# Patient Record
Sex: Female | Born: 1980 | Race: White | Hispanic: No | Marital: Married | Smoking: Never smoker
Health system: Southern US, Community
[De-identification: ages and names within clinical notes are randomized; demographics above are authoritative.]

## PROBLEM LIST (undated history)

## (undated) DIAGNOSIS — D219 Benign neoplasm of connective and other soft tissue, unspecified: Secondary | ICD-10-CM

---

## 2005-03-09 ENCOUNTER — Other Ambulatory Visit: Admission: RE | Admit: 2005-03-09 | Discharge: 2005-03-09 | Payer: Self-pay | Admitting: Family Medicine

## 2006-03-21 ENCOUNTER — Other Ambulatory Visit: Admission: RE | Admit: 2006-03-21 | Discharge: 2006-03-21 | Payer: Self-pay | Admitting: Family Medicine

## 2006-04-26 ENCOUNTER — Encounter: Admission: RE | Admit: 2006-04-26 | Discharge: 2006-04-26 | Payer: Self-pay | Admitting: Family Medicine

## 2007-04-25 ENCOUNTER — Other Ambulatory Visit: Admission: RE | Admit: 2007-04-25 | Discharge: 2007-04-25 | Payer: Self-pay | Admitting: Family Medicine

## 2008-05-09 ENCOUNTER — Other Ambulatory Visit: Admission: RE | Admit: 2008-05-09 | Discharge: 2008-05-09 | Payer: Self-pay | Admitting: Family Medicine

## 2008-09-26 ENCOUNTER — Encounter: Admission: RE | Admit: 2008-09-26 | Discharge: 2008-09-26 | Payer: Self-pay | Admitting: Family Medicine

## 2009-05-09 ENCOUNTER — Other Ambulatory Visit: Admission: RE | Admit: 2009-05-09 | Discharge: 2009-05-09 | Payer: Self-pay | Admitting: Family Medicine

## 2010-06-03 ENCOUNTER — Other Ambulatory Visit (HOSPITAL_COMMUNITY)
Admission: RE | Admit: 2010-06-03 | Discharge: 2010-06-03 | Disposition: A | Discharge status: Home / Self Care | Payer: BC Managed Care – PPO | Source: Ambulatory Visit | Attending: Family Medicine | Admitting: Family Medicine

## 2010-06-03 ENCOUNTER — Other Ambulatory Visit: Payer: Self-pay | Admitting: Family Medicine

## 2010-06-03 ENCOUNTER — Other Ambulatory Visit: Payer: Self-pay | Admitting: Physician Assistant

## 2010-06-03 DIAGNOSIS — Z124 Encounter for screening for malignant neoplasm of cervix: Secondary | ICD-10-CM | POA: Insufficient documentation

## 2010-06-03 DIAGNOSIS — Z09 Encounter for follow-up examination after completed treatment for conditions other than malignant neoplasm: Secondary | ICD-10-CM

## 2010-06-09 ENCOUNTER — Ambulatory Visit
Admission: RE | Admit: 2010-06-09 | Discharge: 2010-06-09 | Disposition: A | Discharge status: Home / Self Care | Payer: BC Managed Care – PPO | Source: Ambulatory Visit | Attending: Family Medicine | Admitting: Family Medicine

## 2010-06-09 DIAGNOSIS — Z09 Encounter for follow-up examination after completed treatment for conditions other than malignant neoplasm: Secondary | ICD-10-CM

## 2011-06-04 ENCOUNTER — Other Ambulatory Visit: Payer: Self-pay | Admitting: Family Medicine

## 2011-06-04 ENCOUNTER — Other Ambulatory Visit (HOSPITAL_COMMUNITY)
Admission: RE | Admit: 2011-06-04 | Discharge: 2011-06-04 | Disposition: A | Discharge status: Home / Self Care | Payer: BC Managed Care – PPO | Source: Ambulatory Visit | Attending: Family Medicine | Admitting: Family Medicine

## 2011-06-04 DIAGNOSIS — D249 Benign neoplasm of unspecified breast: Secondary | ICD-10-CM

## 2011-06-04 DIAGNOSIS — Z124 Encounter for screening for malignant neoplasm of cervix: Secondary | ICD-10-CM | POA: Insufficient documentation

## 2011-06-15 ENCOUNTER — Ambulatory Visit
Admission: RE | Admit: 2011-06-15 | Discharge: 2011-06-15 | Disposition: A | Discharge status: Home / Self Care | Payer: BC Managed Care – PPO | Source: Ambulatory Visit | Attending: Family Medicine | Admitting: Family Medicine

## 2011-06-15 DIAGNOSIS — D249 Benign neoplasm of unspecified breast: Secondary | ICD-10-CM

## 2011-12-04 ENCOUNTER — Encounter (HOSPITAL_COMMUNITY): Payer: Self-pay | Admitting: Physical Medicine and Rehabilitation

## 2011-12-04 ENCOUNTER — Emergency Department (HOSPITAL_COMMUNITY): Payer: BC Managed Care – PPO

## 2011-12-04 ENCOUNTER — Emergency Department (HOSPITAL_COMMUNITY)
Admission: EM | Admit: 2011-12-04 | Discharge: 2011-12-04 | Disposition: A | Discharge status: Home / Self Care | Payer: BC Managed Care – PPO | Attending: Emergency Medicine | Admitting: Emergency Medicine

## 2011-12-04 DIAGNOSIS — M79609 Pain in unspecified limb: Secondary | ICD-10-CM | POA: Insufficient documentation

## 2011-12-04 DIAGNOSIS — W1809XA Striking against other object with subsequent fall, initial encounter: Secondary | ICD-10-CM | POA: Insufficient documentation

## 2011-12-04 DIAGNOSIS — Y9364 Activity, baseball: Secondary | ICD-10-CM | POA: Insufficient documentation

## 2011-12-04 DIAGNOSIS — S53106A Unspecified dislocation of unspecified ulnohumeral joint, initial encounter: Secondary | ICD-10-CM | POA: Insufficient documentation

## 2011-12-04 MED ORDER — HYDROMORPHONE HCL PF 1 MG/ML IJ SOLN
0.5000 mg | Freq: Once | INTRAMUSCULAR | Status: DC
  Filled 2011-12-04: qty 1

## 2011-12-04 MED ORDER — HYDROMORPHONE HCL PF 1 MG/ML IJ SOLN: 1.0000 mg | Freq: Once | INTRAMUSCULAR | Status: DC

## 2011-12-04 MED ORDER — ONDANSETRON HCL 4 MG/2ML IJ SOLN
4.0000 mg | Freq: Once | INTRAMUSCULAR | Status: AC
  Administered 2011-12-04: 4 mg via INTRAVENOUS
  Filled 2011-12-04: qty 2

## 2011-12-04 MED ORDER — HYDROMORPHONE HCL PF 1 MG/ML IJ SOLN
INTRAMUSCULAR | Status: AC
  Filled 2011-12-04: qty 1

## 2011-12-04 MED ORDER — HYDROMORPHONE HCL PF 1 MG/ML IJ SOLN
0.5000 mg | Freq: Once | INTRAMUSCULAR | Status: AC
  Administered 2011-12-04: 0.5 mg via INTRAVENOUS

## 2011-12-04 MED ORDER — METOCLOPRAMIDE HCL 5 MG/ML IJ SOLN
5.0000 mg | Freq: Once | INTRAMUSCULAR | Status: AC
  Administered 2011-12-04: 5 mg via INTRAVENOUS
  Filled 2011-12-04: qty 2

## 2011-12-04 MED ORDER — PROPOFOL 10 MG/ML IV BOLUS
INTRAVENOUS | Status: AC
  Filled 2011-12-04: qty 40

## 2011-12-04 MED ORDER — ONDANSETRON HCL 4 MG/2ML IJ SOLN
INTRAMUSCULAR | Status: AC
  Administered 2011-12-04: 4 mg via INTRAVENOUS
  Filled 2011-12-04: qty 2

## 2011-12-04 MED ORDER — HYDROCODONE-ACETAMINOPHEN 5-500 MG PO TABS: 1.0000 | ORAL_TABLET | Freq: Four times a day (QID) | ORAL | Status: AC | PRN

## 2011-12-04 MED ORDER — IBUPROFEN 600 MG PO TABS: 600.0000 mg | ORAL_TABLET | Freq: Four times a day (QID) | ORAL | Status: AC | PRN

## 2011-12-04 MED ORDER — PROPOFOL 10 MG/ML IV BOLUS
INTRAVENOUS | Status: AC | PRN
  Administered 2011-12-04: 80 mg via INTRAVENOUS

## 2011-12-04 MED ORDER — PROPOFOL 10 MG/ML IV BOLUS: 0.5000 mg/kg | Freq: Once | INTRAVENOUS | Status: DC

## 2011-12-04 MED ORDER — HYDROMORPHONE HCL PF 1 MG/ML IJ SOLN
1.0000 mg | Freq: Once | INTRAMUSCULAR | Status: AC
  Administered 2011-12-04: 1 mg via INTRAVENOUS

## 2011-12-04 MED ORDER — ONDANSETRON HCL 4 MG/2ML IJ SOLN
4.0000 mg | Freq: Once | INTRAMUSCULAR | Status: AC
  Administered 2011-12-04: 4 mg via INTRAVENOUS

## 2011-12-04 NOTE — Progress Notes (Signed)
Orthopedic Tech Progress Note Patient Details:  Sherry Herman Mar 28, 1981 [id number]  Ortho Devices Type of Ortho Device: Arm foam sling;Long arm splint Ortho Device/Splint Location: (L) UE Ortho Device/Splint Interventions: Application   Jennye Moccasin 12/04/2011, 9:41 PM

## 2011-12-04 NOTE — ED Provider Notes (Signed)
History     CSN: [id number]  Arrival date & time 12/04/11  [name]   First MD Initiated Contact with Patient 12/04/11 2105      Chief Complaint  Patient presents with  . Arm Pain    (Consider location/radiation/quality/duration/timing/severity/associated sxs/prior treatment) HPI Comments: Sherry Herman states she was playing softball when she dove for a ball landing with her outstretched left hand feeling a pop in her elbow.  She has an obvious deformity to the elbow.  Full range of motion of the wrist.  Positive distal pulses and, color  Patient is a 31 y.o. female presenting with arm pain. The history is provided by the patient.  Arm Pain This is a new problem. The current episode started today. The problem occurs constantly. The problem has been unchanged. Associated symptoms include joint swelling. Pertinent negatives include no fever, nausea, numbness or weakness.    No past medical history on file.  No past surgical history on file.  No family history on file.  History  Substance Use Topics  . Smoking status: Never Smoker   . Smokeless tobacco: Not on file  . Alcohol Use: No    OB History    Grav Para Term Preterm Abortions TAB SAB Ect Mult Living                  Review of Systems  Constitutional: Negative for fever.  HENT: Negative for ear pain.   Gastrointestinal: Negative for nausea.  Musculoskeletal: Positive for joint swelling.  Skin: Negative for wound.  Neurological: Negative for dizziness, weakness and numbness.    Allergies  Review of patient's allergies indicates no known allergies.  Home Medications  No current outpatient prescriptions on file.  BP 118/79  Pulse 73  Temp 98.7 F (37.1 C) (Oral)  Resp 22  SpO2 98%  LMP 11/27/2011  Physical Exam  Constitutional: She appears well-developed and well-nourished.  HENT:  Head: Normocephalic.  Eyes: Pupils are equal, round, and reactive to light.  Neck: Normal range of motion.  Cardiovascular:  Normal rate.   Musculoskeletal: She exhibits tenderness.       Arms: Neurological: She is alert.  Skin: Skin is warm.    ED Course  ORTHOPEDIC INJURY TREATMENT Date/Time: 12/04/2011 10:14 PM Performed by: Arman Filter Authorized by: Arman Filter Consent: Written consent obtained. Consent given by: patient Patient understanding: patient states understanding of the procedure being performed Patient consent: the patient's understanding of the procedure matches consent given Procedure consent: procedure consent matches procedure scheduled Relevant documents: relevant documents present and verified Imaging studies: imaging studies available Patient identity confirmed: verbally with patient Time out: Immediately prior to procedure a "time out" was called to verify the correct patient, procedure, equipment, support staff and site/side marked as required. Injury location: elbow Location details: left elbow Injury type: dislocation Dislocation type: posterior Pre-procedure neurovascular assessment: neurovascularly intact Pre-procedure distal perfusion: normal Pre-procedure neurological function: normal Pre-procedure range of motion: reduced Local anesthesia used: no Patient sedated: yes Sedatives: propofol Analgesia: hydromorphone Manipulation performed: yes Reduction method: supination and flexion Reduction successful: yes X-ray confirmed reduction: yes Immobilization: splint Post-procedure distal perfusion: normal Post-procedure neurological function: normal Post-procedure range of motion: normal Patient tolerance: Patient tolerated the procedure well with no immediate complications.   (including critical care time)  Labs Reviewed - No data to display Dg Elbow 2 Views Left  12/04/2011  *RADIOLOGY REPORT*  Clinical Data: 31 year old female status post blunt trauma with pain.  LEFT ELBOW - 2 VIEW  Comparison: None.  Findings: Posterior dislocation of the ulna and radial head  from the distal left humerus.  Overriding of fragments by a 25 mm. Lateral dislocation as well, up to one half shaft width.  No definite fracture fragments are identified.  IMPRESSION: Posterior and lateral dislocation of both the proximal ulna and radial head at the elbow.  Posterior overriding of fragments by 26 mm.   Original Report Authenticated By: Harley Hallmark, M.D.    Dg Humerus Left  12/04/2011  *RADIOLOGY REPORT*  Clinical Data: 31 year old female with pain after blunt trauma.  LEFT HUMERUS - 2+ VIEW  Comparison: Left elbow series from the same day reported separately.  Findings: Fracture dislocation of the left elbow.  More proximal left humerus appears intact.  Grossly normal alignment about the left shoulder.  Negative visualized ribs.  IMPRESSION:  1.  Left elbow fracture dislocation, the left elbow series reported separately. 2.  More proximal left humerus and osseous structures about the left shoulder appear intact.   Original Report Authenticated By: Harley Hallmark, M.D.      No diagnosis found.  Procedural sedation conducted by Dr. Donette Larry,  manipulation and relocation was performed by myself  MDM   Will start IV pain control, conscience sedation to relocate  Successful relocation of the posterior dislocation of the ulna and radial head.  Patient was placed in a long-arm posterior splint.  Will followup with orthopedics.  Next week        Arman Filter, NP 12/04/11 2219  Arman Filter, NP 12/04/11 2221

## 2011-12-04 NOTE — ED Notes (Signed)
Pt presents to department for evaluation of L arm pain. States she was playing softball when she dove for ball on ground and landed on L elbow/arm. Obvious deformity noted upon arrival. Also states numbness/tingling to fingers. Able to wiggle digits. Pulses intact. Also states nausea. She is alert and oriented x4.

## 2011-12-04 NOTE — ED Notes (Signed)
Pt awake at this time, able to state name, location, and president.

## 2011-12-04 NOTE — ED Notes (Signed)
Pt given saltine crackers and coca cola.

## 2011-12-04 NOTE — ED Provider Notes (Signed)
Procedural sedation Performed by: Glynn Octave Consent: Verbal consent obtained. Risks and benefits: risks, benefits and alternatives were discussed Required items: required blood products, implants, devices, and special equipment available Patient identity confirmed: arm band and provided demographic data Time out: Immediately prior to procedure a "time out" was called to verify the correct patient, procedure, equipment, support staff and site/side marked as required.  Sedation type: moderate (conscious) sedation NPO time confirmed and considedered  Sedatives: PROPOFOL  Physician Time at Bedside: 15 minutes  Vitals: Vital signs were monitored during sedation. Cardiac Monitor, pulse oximeter Patient tolerance: Patient tolerated the procedure well with no immediate complications. Comments: Pt with uneventful recovered. Returned to pre-procedural sedation baseline  Medical screening examination/treatment/procedure(s) were conducted as a shared visit with non-physician practitioner(s) and myself.  I personally evaluated the patient during the encounter      Glynn Octave, MD 12/04/11 2149

## 2011-12-04 NOTE — ED Notes (Signed)
Patient states her L arm feels much better at this time.

## 2011-12-05 NOTE — ED Provider Notes (Signed)
Medical screening examination/treatment/procedure(s) were conducted as a shared visit with non-physician practitioner(s) and myself.  I personally evaluated the patient during the encounter   Glynn Octave, MD 12/05/11 [phone]

## 2012-07-27 ENCOUNTER — Other Ambulatory Visit (HOSPITAL_COMMUNITY)
Admission: RE | Admit: 2012-07-27 | Discharge: 2012-07-27 | Disposition: A | Discharge status: Home / Self Care | Payer: BC Managed Care – PPO | Source: Ambulatory Visit | Attending: Family Medicine | Admitting: Family Medicine

## 2012-07-27 ENCOUNTER — Other Ambulatory Visit: Payer: Self-pay | Admitting: Physician Assistant

## 2012-07-27 DIAGNOSIS — Z124 Encounter for screening for malignant neoplasm of cervix: Secondary | ICD-10-CM | POA: Insufficient documentation

## 2013-07-30 ENCOUNTER — Other Ambulatory Visit (HOSPITAL_COMMUNITY)
Admission: RE | Admit: 2013-07-30 | Discharge: 2013-07-30 | Disposition: A | Discharge status: Home / Self Care | Payer: BC Managed Care – PPO | Source: Ambulatory Visit | Attending: Family Medicine | Admitting: Family Medicine

## 2013-07-30 ENCOUNTER — Other Ambulatory Visit: Payer: Self-pay | Admitting: Physician Assistant

## 2013-07-30 DIAGNOSIS — Z124 Encounter for screening for malignant neoplasm of cervix: Secondary | ICD-10-CM | POA: Insufficient documentation

## 2014-08-01 ENCOUNTER — Other Ambulatory Visit (HOSPITAL_COMMUNITY): Payer: Self-pay | Admitting: Physician Assistant

## 2014-08-01 ENCOUNTER — Other Ambulatory Visit (HOSPITAL_COMMUNITY)
Admission: RE | Admit: 2014-08-01 | Discharge: 2014-08-01 | Disposition: A | Discharge status: Home / Self Care | Payer: BC Managed Care – PPO | Source: Ambulatory Visit | Attending: Family Medicine | Admitting: Family Medicine

## 2014-08-01 DIAGNOSIS — Z124 Encounter for screening for malignant neoplasm of cervix: Secondary | ICD-10-CM | POA: Diagnosis present

## 2014-08-05 LAB — CYTOLOGY - PAP

## 2015-08-05 ENCOUNTER — Other Ambulatory Visit: Payer: Self-pay | Admitting: Physician Assistant

## 2015-08-21 LAB — CYTOLOGY - PAP

## 2016-08-05 ENCOUNTER — Other Ambulatory Visit: Payer: Self-pay | Admitting: Physician Assistant

## 2016-08-05 ENCOUNTER — Other Ambulatory Visit (HOSPITAL_COMMUNITY)
Admission: RE | Admit: 2016-08-05 | Discharge: 2016-08-05 | Disposition: A | Discharge status: Home / Self Care | Payer: BC Managed Care – PPO | Source: Ambulatory Visit | Attending: Family Medicine | Admitting: Family Medicine

## 2016-08-05 DIAGNOSIS — Z124 Encounter for screening for malignant neoplasm of cervix: Secondary | ICD-10-CM | POA: Insufficient documentation

## 2016-08-10 LAB — CYTOLOGY - PAP: DIAGNOSIS: NEGATIVE

## 2018-12-14 ENCOUNTER — Other Ambulatory Visit: Payer: Self-pay | Admitting: Physician Assistant

## 2018-12-14 DIAGNOSIS — N852 Hypertrophy of uterus: Secondary | ICD-10-CM

## 2018-12-20 ENCOUNTER — Ambulatory Visit
Admission: RE | Admit: 2018-12-20 | Discharge: 2018-12-20 | Disposition: A | Discharge status: Home / Self Care | Payer: BC Managed Care – PPO | Source: Ambulatory Visit | Attending: Physician Assistant | Admitting: Physician Assistant

## 2018-12-20 ENCOUNTER — Other Ambulatory Visit: Payer: Self-pay

## 2018-12-20 DIAGNOSIS — N852 Hypertrophy of uterus: Secondary | ICD-10-CM

## 2019-04-25 ENCOUNTER — Inpatient Hospital Stay: Admit: 2019-04-25 | Payer: BC Managed Care – PPO | Admitting: Obstetrics and Gynecology

## 2019-04-25 SURGERY — MYOMECTOMY, ABDOMINAL APPROACH
Anesthesia: Choice

## 2019-07-03 ENCOUNTER — Other Ambulatory Visit: Payer: Self-pay | Admitting: Obstetrics and Gynecology

## 2019-08-07 NOTE — Progress Notes (Signed)
[id number] - [place], [place] - [address] [place] [address] Phone: [phone] Fax: [phone]    Your procedure is scheduled on Wednesday, May 19th.  Report to [facility] Main Entrance "A" at 6:30 A.M., and check in at the Admitting office.  Call this number if you have problems the morning of surgery:  [phone]  Call [phone] if you have any questions prior to your surgery date Monday-Friday 8am-4pm   Remember:  Do not eat after midnight the night before your surgery  You may drink clear liquids until 5:30 A.M. the morning of your surgery.   Clear liquids allowed are: Water, Non-Citrus Juices (without pulp), Carbonated Beverages, Clear Tea, Black Coffee Only, and Gatorade    Take these medicines the morning of surgery with A SIP OF WATER  TRI-LO-MARZIA  As of today, STOP taking any Aspirin (unless otherwise instructed by your surgeon) and Aspirin containing products, Aleve, Naproxen, Ibuprofen, Motrin, Advil, Goody's, BC's, all herbal medications, fish oil, and all vitamins.             Do not wear jewelry, make up, or nail polish            Do not wear lotions, powders, perfumes, or deodorant.            Do not shave 48 hours prior to surgery.              Do not bring valuables to the hospital.            [facility] is not responsible for any belongings or valuables.  Do NOT Smoke (Tobacco/Vapping) or drink Alcohol 24 hours prior to your procedure If you use a CPAP at night, you may bring all equipment for your overnight stay.   Contacts, glasses, dentures or bridgework may not be worn into surgery.      For patients admitted to the hospital, discharge time will be determined by your treatment team.   Patients discharged the day of surgery will not be allowed to drive home, and someone needs to stay with them for 24 hours.  Special instructions:   [place]- Preparing For Surgery  Before surgery, you can play an important  role. Because skin is not sterile, your skin needs to be as free of germs as possible. You can reduce the number of germs on your skin by washing with CHG (chlorahexidine gluconate) Soap before surgery.  CHG is an antiseptic cleaner which kills germs and bonds with the skin to continue killing germs even after washing.    Oral Hygiene is also important to reduce your risk of infection.  Remember - BRUSH YOUR TEETH THE MORNING OF SURGERY WITH YOUR REGULAR TOOTHPASTE  Please do not use if you have an allergy to CHG or antibacterial soaps. If your skin becomes reddened/irritated stop using the CHG.  Do not shave (including legs and underarms) for at least 48 hours prior to first CHG shower. It is OK to shave your face.  Please follow these instructions carefully.   1. Shower the NIGHT BEFORE SURGERY and the MORNING OF SURGERY with CHG Soap.   2. If you chose to wash your hair, wash your hair first as usual with your normal shampoo.  3. After you shampoo, rinse your hair and body thoroughly to remove the shampoo.  4. Use CHG as you would any other liquid soap. You can apply CHG directly to the skin and wash gently with a scrungie  or a clean washcloth.   5. Apply the CHG Soap to your body ONLY FROM THE NECK DOWN.  Do not use on open wounds or open sores. Avoid contact with your eyes, ears, mouth and genitals (private parts). Wash Face and genitals (private parts)  with your normal soap.   6. Wash thoroughly, paying special attention to the area where your surgery will be performed.  7. Thoroughly rinse your body with warm water from the neck down.  8. DO NOT shower/wash with your normal soap after using and rinsing off the CHG Soap.  9. Pat yourself dry with a CLEAN TOWEL.  10. Wear CLEAN PAJAMAS to bed the night before surgery, wear comfortable clothes the morning of surgery  11. Place CLEAN SHEETS on your bed the night of your first shower and DO NOT SLEEP WITH PETS.  Day of  Surgery: Shower with CHG as instructed above.  Do not apply any deodorants/lotions.  Please wear clean clothes to the hospital/surgery center.   Remember to brush your teeth WITH YOUR REGULAR TOOTHPASTE.   Please read over the following fact sheets that you were given.

## 2019-08-08 ENCOUNTER — Encounter (HOSPITAL_COMMUNITY): Payer: Self-pay

## 2019-08-08 ENCOUNTER — Other Ambulatory Visit: Payer: Self-pay

## 2019-08-08 ENCOUNTER — Encounter (HOSPITAL_COMMUNITY)
Admission: RE | Admit: 2019-08-08 | Discharge: 2019-08-08 | Disposition: A | Discharge status: Home / Self Care | Payer: BC Managed Care – PPO | Source: Ambulatory Visit | Attending: Obstetrics and Gynecology | Admitting: Obstetrics and Gynecology

## 2019-08-08 DIAGNOSIS — Z01812 Encounter for preprocedural laboratory examination: Secondary | ICD-10-CM | POA: Diagnosis not present

## 2019-08-08 LAB — CBC
HCT: 34.1 % — ABNORMAL LOW (ref 36.0–46.0)
Hemoglobin: 9.9 g/dL — ABNORMAL LOW (ref 12.0–15.0)
MCH: 19.8 pg — ABNORMAL LOW (ref 26.0–34.0)
MCHC: 29 g/dL — ABNORMAL LOW (ref 30.0–36.0)
MCV: 68.3 fL — ABNORMAL LOW (ref 80.0–100.0)
Platelets: 332 10*3/uL (ref 150–400)
RBC: 4.99 MIL/uL (ref 3.87–5.11)
RDW: 17.2 % — ABNORMAL HIGH (ref 11.5–15.5)
WBC: 8.5 10*3/uL (ref 4.0–10.5)
nRBC: 0 % (ref 0.0–0.2)

## 2019-08-08 LAB — TYPE AND SCREEN
ABO/RH(D): A POS
Antibody Screen: NEGATIVE

## 2019-08-08 LAB — ABO/RH: ABO/RH(D): A POS

## 2019-08-08 NOTE — Progress Notes (Signed)
PCP - [place], PA  Cardiologist - pt denies  Chest x-ray - n/a EKG - n/a Stress Test - pt denies ECHO - pt denies Cardiac Cath - pt denies  Blood Thinner Instructions: n/a Aspirin Instructions:n/a  ERAS Protcol - yes PRE-SURGERY Ensure or G2- not ordered  COVID TEST- 08/13/19  Coronavirus Screening  Have you experienced the following symptoms:  Cough yes/no: No Fever (>100.68F)  yes/no: No Runny nose yes/no: No Sore throat yes/no: No Difficulty breathing/shortness of breath  yes/no: No  Have you or a family member traveled in the last 14 days and where? yes/no: No   If the patient indicates "YES" to the above questions, their PAT will be rescheduled to limit the exposure to others and, the surgeon will be notified. THE PATIENT WILL NEED TO BE ASYMPTOMATIC FOR 14 DAYS.   If the patient is not experiencing any of these symptoms, the PAT nurse will instruct them to NOT bring anyone with them to their appointment since they may have these symptoms or traveled as well.   Please remind your patients and families that hospital visitation restrictions are in effect and the importance of the restrictions.     Anesthesia review: n/a  Patient denies shortness of breath, fever, cough and chest pain at PAT appointment   All instructions explained to the patient, with a verbal understanding of the material. Patient agrees to go over the instructions while at home for a better understanding. Patient also instructed to self quarantine after being tested for COVID-19. The opportunity to ask questions was provided.

## 2019-08-13 ENCOUNTER — Other Ambulatory Visit (HOSPITAL_COMMUNITY)
Admission: RE | Admit: 2019-08-13 | Discharge: 2019-08-13 | Disposition: A | Discharge status: Home / Self Care | Payer: BC Managed Care – PPO | Source: Ambulatory Visit | Attending: Obstetrics and Gynecology | Admitting: Obstetrics and Gynecology

## 2019-08-13 ENCOUNTER — Other Ambulatory Visit: Payer: Self-pay | Admitting: Physician Assistant

## 2019-08-13 ENCOUNTER — Other Ambulatory Visit (HOSPITAL_COMMUNITY)
Admission: RE | Admit: 2019-08-13 | Discharge: 2019-08-13 | Disposition: A | Discharge status: Home / Self Care | Payer: BC Managed Care – PPO | Source: Ambulatory Visit | Attending: Physician Assistant | Admitting: Physician Assistant

## 2019-08-13 DIAGNOSIS — Z124 Encounter for screening for malignant neoplasm of cervix: Secondary | ICD-10-CM | POA: Diagnosis present

## 2019-08-13 DIAGNOSIS — Z20822 Contact with and (suspected) exposure to covid-19: Secondary | ICD-10-CM | POA: Insufficient documentation

## 2019-08-14 LAB — SARS CORONAVIRUS 2 (TAT 6-24 HRS): SARS Coronavirus 2: NEGATIVE

## 2019-08-15 ENCOUNTER — Inpatient Hospital Stay (HOSPITAL_COMMUNITY): Payer: BC Managed Care – PPO | Admitting: Certified Registered Nurse Anesthetist

## 2019-08-15 ENCOUNTER — Encounter (HOSPITAL_COMMUNITY): Payer: Self-pay | Admitting: Obstetrics and Gynecology

## 2019-08-15 ENCOUNTER — Other Ambulatory Visit: Payer: Self-pay

## 2019-08-15 ENCOUNTER — Encounter (HOSPITAL_COMMUNITY)
Admission: RE | Disposition: A | Discharge status: Home / Self Care | Payer: Self-pay | Source: Home / Self Care | Attending: Obstetrics and Gynecology

## 2019-08-15 ENCOUNTER — Inpatient Hospital Stay (HOSPITAL_COMMUNITY)
Admission: RE | Admit: 2019-08-15 | Discharge: 2019-08-16 | DRG: 743 | Disposition: A | Discharge status: Home / Self Care | Payer: BC Managed Care – PPO | Attending: Obstetrics and Gynecology | Admitting: Obstetrics and Gynecology

## 2019-08-15 DIAGNOSIS — Z793 Long term (current) use of hormonal contraceptives: Secondary | ICD-10-CM

## 2019-08-15 DIAGNOSIS — Z9889 Other specified postprocedural states: Secondary | ICD-10-CM

## 2019-08-15 DIAGNOSIS — D259 Leiomyoma of uterus, unspecified: Secondary | ICD-10-CM | POA: Diagnosis present

## 2019-08-15 DIAGNOSIS — B001 Herpesviral vesicular dermatitis: Secondary | ICD-10-CM | POA: Diagnosis present

## 2019-08-15 DIAGNOSIS — Z885 Allergy status to narcotic agent status: Secondary | ICD-10-CM

## 2019-08-15 DIAGNOSIS — N83292 Other ovarian cyst, left side: Secondary | ICD-10-CM | POA: Diagnosis present

## 2019-08-15 DIAGNOSIS — D219 Benign neoplasm of connective and other soft tissue, unspecified: Secondary | ICD-10-CM | POA: Diagnosis present

## 2019-08-15 DIAGNOSIS — Z79899 Other long term (current) drug therapy: Secondary | ICD-10-CM

## 2019-08-15 HISTORY — PX: MYOMECTOMY: SHX85

## 2019-08-15 HISTORY — DX: Benign neoplasm of connective and other soft tissue, unspecified: D21.9

## 2019-08-15 HISTORY — PX: MYOMECTOMY ABDOMINAL APPROACH: SUR870

## 2019-08-15 LAB — POCT PREGNANCY, URINE: Preg Test, Ur: NEGATIVE

## 2019-08-15 SURGERY — MYOMECTOMY, ABDOMINAL APPROACH
Anesthesia: General | Site: Abdomen

## 2019-08-15 MED ORDER — KETOROLAC TROMETHAMINE 30 MG/ML IJ SOLN
INTRAMUSCULAR | Status: DC | PRN
Start: 2019-08-15 — End: 2019-08-15
  Administered 2019-08-15: 30 mg via INTRAVENOUS

## 2019-08-15 MED ORDER — PROPOFOL 10 MG/ML IV BOLUS
INTRAVENOUS | Status: AC
  Filled 2019-08-15: qty 40

## 2019-08-15 MED ORDER — LACTATED RINGERS IV SOLN: INTRAVENOUS | Status: DC | PRN

## 2019-08-15 MED ORDER — ONDANSETRON HCL 4 MG/2ML IJ SOLN
INTRAMUSCULAR | Status: DC | PRN
  Administered 2019-08-15: 4 mg via INTRAVENOUS

## 2019-08-15 MED ORDER — OXYCODONE HCL 5 MG PO TABS: 5.0000 mg | ORAL_TABLET | Freq: Once | ORAL | Status: DC | PRN

## 2019-08-15 MED ORDER — DEXAMETHASONE SODIUM PHOSPHATE 10 MG/ML IJ SOLN
INTRAMUSCULAR | Status: DC | PRN
  Administered 2019-08-15: 5 mg via INTRAVENOUS

## 2019-08-15 MED ORDER — SUGAMMADEX SODIUM 200 MG/2ML IV SOLN
INTRAVENOUS | Status: DC | PRN
  Administered 2019-08-15: 200 mg via INTRAVENOUS

## 2019-08-15 MED ORDER — MIDAZOLAM HCL 2 MG/2ML IJ SOLN
INTRAMUSCULAR | Status: DC | PRN
  Administered 2019-08-15: 2 mg via INTRAVENOUS

## 2019-08-15 MED ORDER — PROMETHAZINE HCL 25 MG/ML IJ SOLN: 6.2500 mg | INTRAMUSCULAR | Status: DC | PRN

## 2019-08-15 MED ORDER — NORGESTIM-ETH ESTRAD TRIPHASIC 0.18/0.215/0.25 MG-25 MCG PO TABS: 1.0000 | ORAL_TABLET | Freq: Every day | ORAL | Status: DC

## 2019-08-15 MED ORDER — IBUPROFEN 800 MG PO TABS
800.0000 mg | ORAL_TABLET | Freq: Three times a day (TID) | ORAL | Status: DC
  Administered 2019-08-15 – 2019-08-16 (×3): 800 mg via ORAL
  Filled 2019-08-15 (×3): qty 1

## 2019-08-15 MED ORDER — FENTANYL CITRATE (PF) 100 MCG/2ML IJ SOLN
INTRAMUSCULAR | Status: AC
  Filled 2019-08-15: qty 2

## 2019-08-15 MED ORDER — FENTANYL CITRATE (PF) 250 MCG/5ML IJ SOLN
INTRAMUSCULAR | Status: AC
  Filled 2019-08-15: qty 5

## 2019-08-15 MED ORDER — CEFAZOLIN SODIUM-DEXTROSE 2-4 GM/100ML-% IV SOLN
2.0000 g | INTRAVENOUS | Status: AC
  Administered 2019-08-15: 2 g via INTRAVENOUS
  Filled 2019-08-15: qty 100

## 2019-08-15 MED ORDER — SENNOSIDES-DOCUSATE SODIUM 8.6-50 MG PO TABS: 1.0000 | ORAL_TABLET | Freq: Every evening | ORAL | Status: DC | PRN

## 2019-08-15 MED ORDER — ONDANSETRON HCL 4 MG/2ML IJ SOLN: 4.0000 mg | Freq: Four times a day (QID) | INTRAMUSCULAR | Status: DC | PRN

## 2019-08-15 MED ORDER — OXYCODONE HCL 5 MG PO TABS: 5.0000 mg | ORAL_TABLET | ORAL | 0 refills | Status: AC | PRN

## 2019-08-15 MED ORDER — VASOPRESSIN 20 UNIT/ML IV SOLN: INTRAVENOUS | Status: DC | PRN

## 2019-08-15 MED ORDER — GLYCOPYRROLATE PF 0.2 MG/ML IJ SOSY
PREFILLED_SYRINGE | INTRAMUSCULAR | Status: DC | PRN
Start: 2019-08-15 — End: 2019-08-15
  Administered 2019-08-15: .1 mg via INTRAVENOUS

## 2019-08-15 MED ORDER — DIPHENHYDRAMINE HCL 50 MG/ML IJ SOLN
INTRAMUSCULAR | Status: AC
  Filled 2019-08-15: qty 1

## 2019-08-15 MED ORDER — SIMETHICONE 80 MG PO CHEW: 80.0000 mg | CHEWABLE_TABLET | Freq: Four times a day (QID) | ORAL | Status: DC | PRN

## 2019-08-15 MED ORDER — PANTOPRAZOLE SODIUM 40 MG PO TBEC
40.0000 mg | DELAYED_RELEASE_TABLET | Freq: Every day | ORAL | Status: DC
  Administered 2019-08-15: 40 mg via ORAL
  Filled 2019-08-15: qty 1

## 2019-08-15 MED ORDER — ONDANSETRON HCL 4 MG PO TABS: 4.0000 mg | ORAL_TABLET | Freq: Four times a day (QID) | ORAL | Status: DC | PRN

## 2019-08-15 MED ORDER — DIPHENHYDRAMINE HCL 50 MG/ML IJ SOLN
INTRAMUSCULAR | Status: DC | PRN
Start: 2019-08-15 — End: 2019-08-15
  Administered 2019-08-15: 25 mg via INTRAVENOUS

## 2019-08-15 MED ORDER — VASOPRESSIN 20 UNIT/ML IV SOLN
INTRAVENOUS | Status: DC | PRN
  Administered 2019-08-15: 10 mL via INTRAMUSCULAR
  Administered 2019-08-15: 3 mL via INTRAMUSCULAR

## 2019-08-15 MED ORDER — LIDOCAINE 2% (20 MG/ML) 5 ML SYRINGE
INTRAMUSCULAR | Status: DC | PRN
  Administered 2019-08-15: 60 mg via INTRAVENOUS

## 2019-08-15 MED ORDER — OXYCODONE HCL 5 MG PO TABS
5.0000 mg | ORAL_TABLET | ORAL | Status: DC | PRN
  Administered 2019-08-15: 5 mg via ORAL
  Administered 2019-08-16: 10 mg via ORAL
  Filled 2019-08-15: qty 1
  Filled 2019-08-15: qty 2

## 2019-08-15 MED ORDER — ROCURONIUM BROMIDE 10 MG/ML (PF) SYRINGE
PREFILLED_SYRINGE | INTRAVENOUS | Status: AC
  Filled 2019-08-15: qty 10

## 2019-08-15 MED ORDER — HYDROMORPHONE HCL 1 MG/ML IJ SOLN: 0.2000 mg | INTRAMUSCULAR | Status: DC | PRN

## 2019-08-15 MED ORDER — MENTHOL 3 MG MT LOZG: 1.0000 | LOZENGE | OROMUCOSAL | Status: DC | PRN

## 2019-08-15 MED ORDER — SCOPOLAMINE 1 MG/3DAYS TD PT72
MEDICATED_PATCH | TRANSDERMAL | Status: DC | PRN
  Administered 2019-08-15: 1 via TRANSDERMAL

## 2019-08-15 MED ORDER — VASOPRESSIN 20 UNIT/ML IV SOLN
INTRAVENOUS | Status: AC
  Filled 2019-08-15: qty 1

## 2019-08-15 MED ORDER — OXYCODONE HCL 5 MG/5ML PO SOLN: 5.0000 mg | Freq: Once | ORAL | Status: DC | PRN

## 2019-08-15 MED ORDER — FENTANYL CITRATE (PF) 250 MCG/5ML IJ SOLN
INTRAMUSCULAR | Status: DC | PRN
  Administered 2019-08-15: 100 ug via INTRAVENOUS
  Administered 2019-08-15: 50 ug via INTRAVENOUS

## 2019-08-15 MED ORDER — PROPOFOL 10 MG/ML IV BOLUS
INTRAVENOUS | Status: DC | PRN
  Administered 2019-08-15: 200 mg via INTRAVENOUS

## 2019-08-15 MED ORDER — SODIUM CHLORIDE (PF) 0.9 % IJ SOLN
INTRAMUSCULAR | Status: AC
  Filled 2019-08-15: qty 50

## 2019-08-15 MED ORDER — FENTANYL CITRATE (PF) 100 MCG/2ML IJ SOLN
25.0000 ug | INTRAMUSCULAR | Status: DC | PRN
  Administered 2019-08-15: 50 ug via INTRAVENOUS

## 2019-08-15 MED ORDER — LIDOCAINE 2% (20 MG/ML) 5 ML SYRINGE
INTRAMUSCULAR | Status: AC
  Filled 2019-08-15: qty 5

## 2019-08-15 MED ORDER — MIDAZOLAM HCL 2 MG/2ML IJ SOLN
INTRAMUSCULAR | Status: AC
  Filled 2019-08-15: qty 2

## 2019-08-15 MED ORDER — LACTATED RINGERS IV SOLN: INTRAVENOUS | Status: DC

## 2019-08-15 MED ORDER — IBUPROFEN 800 MG PO TABS: 800.0000 mg | ORAL_TABLET | Freq: Three times a day (TID) | ORAL | 0 refills | Status: AC

## 2019-08-15 MED ORDER — DEXAMETHASONE SODIUM PHOSPHATE 10 MG/ML IJ SOLN
INTRAMUSCULAR | Status: AC
  Filled 2019-08-15: qty 1

## 2019-08-15 MED ORDER — GLYCOPYRROLATE PF 0.2 MG/ML IJ SOSY
PREFILLED_SYRINGE | INTRAMUSCULAR | Status: AC
  Filled 2019-08-15: qty 1

## 2019-08-15 MED ORDER — ROCURONIUM BROMIDE 10 MG/ML (PF) SYRINGE
PREFILLED_SYRINGE | INTRAVENOUS | Status: DC | PRN
  Administered 2019-08-15: 50 mg via INTRAVENOUS
  Administered 2019-08-15: 20 mg via INTRAVENOUS

## 2019-08-15 MED ORDER — ONDANSETRON HCL 4 MG/2ML IJ SOLN
INTRAMUSCULAR | Status: AC
  Filled 2019-08-15: qty 2

## 2019-08-15 SURGICAL SUPPLY — 47 items
ADH SKN CLS APL DERMABOND .7 (GAUZE/BANDAGES/DRESSINGS) ×1
APL SKNCLS STERI-STRIP NONHPOA (GAUZE/BANDAGES/DRESSINGS) ×1
BARRIER ADHS 3X4 INTERCEED (GAUZE/BANDAGES/DRESSINGS) ×2 IMPLANT
BENZOIN TINCTURE PRP APPL 2/3 (GAUZE/BANDAGES/DRESSINGS) ×3 IMPLANT
BRR ADH 4X3 ABS CNTRL BYND (GAUZE/BANDAGES/DRESSINGS) ×1
CANISTER SUCT 3000ML PPV (MISCELLANEOUS) ×3 IMPLANT
CLOSURE WOUND 1/2 X4 (GAUZE/BANDAGES/DRESSINGS) ×1
DERMABOND ADVANCED (GAUZE/BANDAGES/DRESSINGS) ×2
DERMABOND ADVANCED .7 DNX12 (GAUZE/BANDAGES/DRESSINGS) IMPLANT
DRAPE CESAREAN BIRTH W POUCH (DRAPES) ×3 IMPLANT
DRSG OPSITE POSTOP 4X10 (GAUZE/BANDAGES/DRESSINGS) ×2 IMPLANT
DURAPREP 26ML APPLICATOR (WOUND CARE) ×3 IMPLANT
GAUZE 4X4 16PLY RFD (DISPOSABLE) ×2 IMPLANT
GLOVE BIO SURGEON STRL SZ7 (GLOVE) ×3 IMPLANT
GLOVE BIOGEL PI IND STRL 7.0 (GLOVE) ×3 IMPLANT
GLOVE BIOGEL PI INDICATOR 7.0 (GLOVE) ×6
GOWN STRL REUS W/ TWL LRG LVL3 (GOWN DISPOSABLE) ×3 IMPLANT
GOWN STRL REUS W/TWL LRG LVL3 (GOWN DISPOSABLE) ×9
HEMOSTAT SURGICEL 2X14 (HEMOSTASIS) IMPLANT
KIT TURNOVER KIT B (KITS) ×3 IMPLANT
NDL HYPO 25X1 1.5 SAFETY (NEEDLE) ×1 IMPLANT
NEEDLE HYPO 25X1 1.5 SAFETY (NEEDLE) ×3 IMPLANT
NS IRRIG 1000ML POUR BTL (IV SOLUTION) ×3 IMPLANT
PACK ABDOMINAL GYN (CUSTOM PROCEDURE TRAY) ×3 IMPLANT
PAD ARMBOARD 7.5X6 YLW CONV (MISCELLANEOUS) ×3 IMPLANT
PAD OB MATERNITY 4.3X12.25 (PERSONAL CARE ITEMS) ×3 IMPLANT
PENCIL SMOKE EVACUATOR (MISCELLANEOUS) ×2 IMPLANT
SPECIMEN JAR MEDIUM (MISCELLANEOUS) ×3 IMPLANT
SPONGE LAP 18X18 RF (DISPOSABLE) IMPLANT
STAPLER VISISTAT 35W (STAPLE) IMPLANT
STRIP CLOSURE SKIN 1/2X4 (GAUZE/BANDAGES/DRESSINGS) ×2 IMPLANT
SUT CHROMIC 2 0 CT 1 (SUTURE) IMPLANT
SUT MON AB 3-0 SH 27 (SUTURE) ×9
SUT MON AB 3-0 SH27 (SUTURE) IMPLANT
SUT MON AB 4-0 PS1 27 (SUTURE) ×3 IMPLANT
SUT PLAIN 2 0 XLH (SUTURE) ×1 IMPLANT
SUT VIC AB 0 CT1 18XCR BRD8 (SUTURE) ×1 IMPLANT
SUT VIC AB 0 CT1 27 (SUTURE) ×6
SUT VIC AB 0 CT1 27XBRD ANBCTR (SUTURE) ×2 IMPLANT
SUT VIC AB 0 CT1 8-18 (SUTURE) ×6
SUT VIC AB 2-0 CT1 (SUTURE) ×3 IMPLANT
SUT VIC AB 3-0 SH 27 (SUTURE) ×6
SUT VIC AB 3-0 SH 27X BRD (SUTURE) ×2 IMPLANT
SYR CONTROL 10ML LL (SYRINGE) ×3 IMPLANT
TOWEL GREEN STERILE FF (TOWEL DISPOSABLE) ×6 IMPLANT
TRAY FOLEY W/BAG SLVR 14FR (SET/KITS/TRAYS/PACK) ×3 IMPLANT
YANKAUER SUCT BULB TIP NO VENT (SUCTIONS) ×2 IMPLANT

## 2019-08-15 NOTE — Anesthesia Postprocedure Evaluation (Signed)
Anesthesia Post Note  Patient: Sherry Herman  Procedure(s) Performed: ABDOMINAL MYOMECTOMY, Drainage Left Ovarian Cyst (N/A Abdomen)     Patient location during evaluation: PACU Anesthesia Type: General Level of consciousness: awake and alert Pain management: pain level controlled Vital Signs Assessment: post-procedure vital signs reviewed and stable Respiratory status: spontaneous breathing, nonlabored ventilation and respiratory function stable Cardiovascular status: blood pressure returned to baseline and stable Postop Assessment: no apparent nausea or vomiting Anesthetic complications: no    Last Vitals:  Vitals:   08/15/19 1205 08/15/19 1341  BP: 101/68 107/68  Pulse: 62 69  Resp: 18 16  Temp:  36.7 C  SpO2: 100% 100%    Last Pain:  Vitals:   08/15/19 1409  TempSrc:   PainSc: [place]

## 2019-08-15 NOTE — Interval H&P Note (Signed)
History and Physical Interval Note:  08/15/2019 8:39 AM  Sherry Herman  has presented today for surgery, with the diagnosis of D21.9 Fibroids.  The various methods of treatment have been discussed with the patient and family. After consideration of risks, benefits and other options for treatment, the patient has consented to  Procedure(s): ABDOMINAL MYOMECTOMY (N/A) as a surgical intervention.  The patient's history has been reviewed, patient examined, no change in status, stable for surgery.  I have reviewed the patient's chart and labs.  Questions were answered to the patient's satisfaction.     Thurnell Lose

## 2019-08-15 NOTE — Transfer of Care (Signed)
Immediate Anesthesia Transfer of Care Note  Patient: Sherry Herman  Procedure(s) Performed: ABDOMINAL MYOMECTOMY, Drainage Left Ovarian Cyst (N/A Abdomen)  Patient Location: PACU  Anesthesia Type:General  Level of Consciousness: awake, alert  and oriented  Airway & Oxygen Therapy: Patient Spontanous Breathing and Patient connected to nasal cannula oxygen  Post-op Assessment: Report given to RN and Post -op Vital signs reviewed and stable  Post vital signs: Reviewed and stable  Last Vitals:  Vitals Value Taken Time  BP 111/65 08/15/19 1112  Temp    Pulse 58 08/15/19 1114  Resp 18 08/15/19 1114  SpO2 100 % 08/15/19 1114  Vitals shown include unvalidated device data.  Last Pain:  Vitals:   08/15/19 0639  TempSrc:   PainSc: 0-No pain      Patients Stated Pain Goal: 3 ([id number])  Complications: No apparent anesthesia complications

## 2019-08-15 NOTE — Anesthesia Procedure Notes (Signed)
Procedure Name: Intubation Date/Time: 08/15/2019 8:43 AM Performed by: Valda Favia, CRNA Pre-anesthesia Checklist: Patient identified, Emergency Drugs available, Suction available and Patient being monitored Patient Re-evaluated:Patient Re-evaluated prior to induction Oxygen Delivery Method: Circle System Utilized Preoxygenation: Pre-oxygenation with 100% oxygen Induction Type: IV induction Ventilation: Mask ventilation without difficulty Laryngoscope Size: Mac and 4 Grade View: Grade I Tube type: Oral Tube size: 7.0 mm Number of attempts: 1 Airway Equipment and Method: Stylet and Oral airway Placement Confirmation: ETT inserted through vocal cords under direct vision,  positive ETCO2 and breath sounds checked- equal and bilateral Secured at: 21 cm Tube secured with: Tape Dental Injury: Teeth and Oropharynx as per pre-operative assessment

## 2019-08-15 NOTE — Discharge Instructions (Signed)
Myomectomy, Care After This sheet gives you information about how to care for yourself after your procedure. Your health care provider may also give you more specific instructions. If you have problems or questions, contact your health care provider. What can I expect after the procedure? After the procedure, it is common to have:  Pain in your abdomen, especially at the incision areas. You will be given pain medicine to control the pain.  Tiredness. This is a normal part of the recovery process. Your energy level will return to normal over the coming weeks.  Vaginal bleeding. This is normal and will stop in the coming weeks.  Constipation. Recovery time from this procedure will depend on the type of procedure you had and your general overall health prior to the procedure. Follow these instructions at home: Medicines  Take over-the-counter and prescription medicines only as told by your health care provider.  Do not take aspirin because it can cause bleeding.  If you were prescribed an antibiotic medicine, use it as told by your health care provider. Do not stop using the antibiotic even if you start to feel better.  Do not drive or use heavy machinery while taking prescription pain medicine.  Do not drink alcohol while taking prescription pain medicine. Incision care   Follow instructions from your health care provider about how to take care of any incisions. Make sure you: ? Wash your hands with soap and water before you change your bandage (dressing). If soap and water are not available, use hand sanitizer. ? Change your dressing as told by your health care provider. ? Leave stitches (sutures), skin glue, or adhesive strips in place. These skin closures may need to stay in place for 2 weeks or longer. If adhesive strip edges start to loosen and curl up, you may trim the loose edges. Do not remove adhesive strips completely unless your health care provider tells you to do  that.  Check your incision areas every day for signs of infection. Check for: ? Redness, swelling, or pain. ? Fluid or blood. ? Warmth. ? Pus or a bad smell.  Do not take baths, swim, or use a hot tub until your health care provider approves. Take showers as directed by your health care provider. Activity  Return to your normal activities as told by your health care provider. Ask your health care provider what activities are safe for you.  Do not do activities that require a lot of effort until your health care provider says it is okay.  Do not lift anything that is heavier than 15 lb (6.8 kg) until your health care provider says that it is safe.  Do not douche, use tampons, or have sexual intercourse until your health care provider approves.  Walk daily but take frequent rest breaks if you tire easily.  Continue to practice deep breathing and coughing. If it hurts to cough, try holding a pillow against your belly as you cough.  Do not drive until your health care provider approves. General instructions  To prevent or treat constipation while you are taking prescription pain medicine, your health care provider may recommend that you: ? Drink enough fluid to keep your urine clear or pale yellow. ? Take over-the-counter or prescription medicines. ? Eat foods that are high in fiber, such as fresh fruits and vegetables, whole grains, and beans. ? Limit foods that are high in fat and processed sugars, such as fried and sweet foods.  Take your temperature twice a day   and write it down. If you develop a fever, this may be a sign that you have an infection.  Do not drink alcohol.  Have someone help you at home for 1 week or until you can do your own household activities.  Keep all follow-up visits as told by your health care provider. This is important. Contact a health care provider if:  You have a fever.  You have increasing abdominal pain that is not relieved with  medicine.  You have nausea, vomiting, or diarrhea.  You have pain when you urinate or you have blood in your urine.  You have a rash on your body.  You have pain or redness where your IV access tube was inserted.  You have redness, swelling, or pain around an incision.  You have fluid or blood coming from an incision.  An incision feels warm to the touch.  You have pus or a bad smell coming from an incision. Get help right away if:  You have weakness or light-headedness.  You have pain, swelling, or redness in your legs.  You have chest pain.  You faint.  You have shortness of breath.  You have heavy vaginal bleeding.  You have an incision that is opening up. Summary  Recovery time from this procedure will depend on the type of procedure you had and your general overall health prior to the procedure.  If you were prescribed an antibiotic medicine, use it as told by your health care provider. Do not stop using the antibiotic even if you start to feel better.  Do not douche, use tampons, or have sexual intercourse until your health care provider approves.  Return to your normal activities as told by your health care provider. Ask your health care provider what activities are safe for you. This information is not intended to replace advice given to you by your health care provider. Make sure you discuss any questions you have with your health care provider. Document Revised: 02/25/2017 Document Reviewed: 04/15/2016 Elsevier Patient Education  2020 [employer]  

## 2019-08-15 NOTE — Anesthesia Preprocedure Evaluation (Addendum)
Anesthesia Evaluation  Patient identified by MRN, date of birth, ID band Patient awake    Reviewed: Allergy & Precautions, NPO status , Patient's Chart, lab work & pertinent test results  History of Anesthesia Complications Negative for: history of anesthetic complications  Airway Mallampati: II  TM Distance: >3 FB Neck ROM: Full    Dental  (+) Dental Advisory Given, Teeth Intact   Pulmonary neg pulmonary ROS,    Pulmonary exam normal        Cardiovascular negative cardio ROS Normal cardiovascular exam     Neuro/Psych negative neurological ROS  negative psych ROS   GI/Hepatic negative GI ROS, Neg liver ROS,   Endo/Other  negative endocrine ROS  Renal/GU negative Renal ROS     Musculoskeletal negative musculoskeletal ROS (+)   Abdominal   Peds  Hematology  (+) anemia ,   Anesthesia Other Findings Covid neg 5/17  Reproductive/Obstetrics  Fibroids                             Anesthesia Physical Anesthesia Plan  ASA: I  Anesthesia Plan: General   Post-op Pain Management:    Induction: Intravenous  PONV Risk Score and Plan: 4 or greater and Treatment may vary due to age or medical condition, Ondansetron, Scopolamine patch - Pre-op, Midazolam and Dexamethasone  Airway Management Planned: Oral ETT  Additional Equipment: None  Intra-op Plan:   Post-operative Plan: Extubation in OR  Informed Consent: I have reviewed the patients History and Physical, chart, labs and discussed the procedure including the risks, benefits and alternatives for the proposed anesthesia with the patient or authorized representative who has indicated his/her understanding and acceptance.     Dental advisory given  Plan Discussed with: CRNA and Anesthesiologist  Anesthesia Plan Comments:        Anesthesia Quick Evaluation

## 2019-08-15 NOTE — Brief Op Note (Signed)
08/15/2019  11:56 AM  PATIENT:  Sherry Herman  39 y.o. female  PRE-OPERATIVE DIAGNOSIS:  D21.9 Fibroids  POST-OPERATIVE DIAGNOSIS:  Fibroids, Left Ovarian Simple Cyst  PROCEDURE:  Procedure(s): ABDOMINAL MYOMECTOMY, Drainage Left Ovarian Cyst (N/A)  SURGEON:  Surgeon(s) and Role:    Thurnell Lose, MD - Primary    * Christophe Louis, MD - Assisting  PHYSICIAN ASSISTANT:   ASSISTANTS: Dr. Landry Mellow and Technician   ANESTHESIA:   general  EBL:  100 mL   BLOOD ADMINISTERED:none  DRAINS: Urinary Catheter (Foley)   LOCAL MEDICATIONS USED:NONE  SPECIMEN:  Source of Specimen:  Fibroids  DISPOSITION OF SPECIMEN:  PATHOLOGY  COUNTS:  YES  TOURNIQUET:  * No tourniquets in log *  DICTATION: .Other Dictation: Dictation Number [id number]  PLAN OF CARE: Admit to inpatient   PATIENT DISPOSITION:  PACU - hemodynamically stable.   Delay start of Pharmacological VTE agent (>24hrs) due to surgical blood loss or risk of bleeding: yes

## 2019-08-15 NOTE — H&P (Signed)
History of Present Illness  Isolation Precautions:          Respiratory Illness Screening             1. Is fever present / reported?  No           2. Are respiratory illness symptom(s) present / reported?  No           3. Are other symptom(s) present / reported?  No           5. Has there been reported travel to a High Risk respiratory illness region?  Unknown           6. Has close* contact with person(s) known to have communicable illness been reported?  No        Has patient been tested for COVID-19? No , No.         Has patient received COVID-19 vaccination? No , No.  General:          39 y/o presents for preoperative examination prior to abdominal myomectomy.        Pelvic US, complete 12/20/2018, uterus measured 16.4 cm, pedunculated leiomyoma measured 8.5 cm, fundal leiomyoma measured 6.5 cm. Endometrium not visualized due to fibroids. Lt ov dominant follow 2.5 cm, rt ov WNL        Denies pain with sex.        Pt states her mother told her she was allergic to Codeine as a child.    Current Medications  TakingOrtho Tri-Cyclen Lo(Norgestimate-Eth Estradiol) 0.025 MG Tablet TAKE 1 TABLET EVERY DAY    Valtrex(valACYclovir HCl) 1 GM Tablet 2 tabs Orally as needed    Zovirax(Acyclovir) 5 % Cream 1 application to affected area every 3 hours Externally Six times a day    Medication List reviewed and reconciled with the patient        Past Medical History       Fever Blisters.            Surgical History  None       Family History  Father: alive 39 yrs, Liver CA (ETOH), AODM  Mother: alive 104 yrs, diagnosed with Diabetes  Brother 1: alive 35 yrs  Brother2: alive 46 yrs  Sister 67: alive 49 yrs  Sister 2: alive 50 yrs  Maternal aunt: alive, Breast cancer  2 brother(s) , 2 sister(s) .   Maternal aunt with breast cancer.      Social History  General:         Tobacco use               cigarettes:  Never smoked             Tobacco history last updated  01/10/2019      no EXPOSURE TO PASSIVE SMOKE.        Alcohol: yes, 1 per week.        Caffeine: none.        no Recreational drug use.        Exercise: yes.        DENTAL CARE: good.        Marital Status: married, husband is disabled.        Children: none.        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        COMMUNICATION BARRIERS: none.     Gyn History  Sexual activity currently sexually active.   Periods : every month.   LMP  07/23/2019.   Birth control ocp.   Last pap smear date 08/05/2016-Normal.   Denies H/O Last mammogram date.   Denies H/O Abnormal pap smear.   Denies H/O STD.      OB History  Never been pregnant  per patient.      Allergies  Codeine (for allergy): unknown - Allergy  Percocet: nausea      Hospitalization/Major Diagnostic Procedure  None       Review of Systems  See scanned ROS form for details. See HPI Denies fever/chills, chest pain, SOB, headaches, numbness/tingling. No h/o complication with anesthesia, bleeding disorders or blood clots See HPI.    Vital Signs  Wt 131.1, Wt change 2.4 lb, Ht 63, BMI 23.22, Temp 98.6, Pulse sitting 74, BP sitting 104/60.    Physical Examination  GENERAL:          Patient appears  alert and oriented , alert and oriented.          General Appearance:  well-appearing, well-developed, no acute distress , well-appearing, well-developed, no acute distress.          Speech:  clear , clear.   LUNGS:          Auscultation:  no wheezing/rhonchi/rales. CTA bilaterally.          Effort:  no respiratory distress, comfortable breathing.   HEART:          Heart sounds:  normal. RRR. no murmur.   ABDOMEN:          General:  soft nontender, nondistended, no masses , no masses tenderness or organomegaly, non distended.   FEMALE GENITOURINARY:          Cervix  visualized, healthy appearing, no discharge, no lesions.          Adnexa:  no mass, non tender.          Uterus:  normal size/shape/consistency, freely mobile, non  tender.          Vagina:  pink/moist mucosa, no lesions, no abnormal discharge.          Vulva:  normal, no lesions, no skin discoloration.          Anus:  no external hemosrhoids.   EXTREMITIES:          general  no edema.          General:  No edema or calf tenderness.       Pt aware of scribe services today.    Assessments    1. Encounter for other preprocedural examination - Z01.818 (Primary)      Treatment  1. Encounter for other preprocedural examination   Notes: Pt counseled on R/B/A of abdominal myomectomy, including but not limited to infection, bleeding and injury to organs in the abdomen. Discussed bikini line incision. Discussed recovery time. No additional pre-operative clearance needed at this time, no concerning medical Dx that may complicate the procedure. Not opposed to blood transfusion if necessary. Plan for f/u 2 week post op..         Procedures  Scribe Documentation:          Attestation:  I personally scribed for Dr. Simona Huh on the date of this appointment. Electronically signed by scribe , Anastasio Auerbach 07/30/2019 04:51:16 PM > .

## 2019-08-15 NOTE — Progress Notes (Signed)
Day of Surgery Procedure(s) (LRB): ABDOMINAL MYOMECTOMY, Drainage Left Ovarian Cyst (N/A)  POD #0   Subjective: Patient reports pain is controlled.    Objective: I have reviewed patient's vital signs and intake and output.  NAD, sitting up in chair, eating with friend  Assessment: s/p Procedure(s): ABDOMINAL MYOMECTOMY, Drainage Left Ovarian Cyst (N/A): progressing well  Plan: Advance diet Discontinue foley  Saline lock Ok for discharge tomorrow if milestones met.  LOS: 0 days    Thurnell Lose 08/15/2019, 7:22 PM

## 2019-08-16 LAB — CBC
HCT: 24.5 % — ABNORMAL LOW (ref 36.0–46.0)
Hemoglobin: 7.3 g/dL — ABNORMAL LOW (ref 12.0–15.0)
MCH: 20.1 pg — ABNORMAL LOW (ref 26.0–34.0)
MCHC: 29.8 g/dL — ABNORMAL LOW (ref 30.0–36.0)
MCV: 67.5 fL — ABNORMAL LOW (ref 80.0–100.0)
Platelets: 239 10*3/uL (ref 150–400)
RBC: 3.63 MIL/uL — ABNORMAL LOW (ref 3.87–5.11)
RDW: 17.2 % — ABNORMAL HIGH (ref 11.5–15.5)
WBC: 16.4 10*3/uL — ABNORMAL HIGH (ref 4.0–10.5)
nRBC: 0 % (ref 0.0–0.2)

## 2019-08-16 LAB — SURGICAL PATHOLOGY

## 2019-08-16 MED ORDER — FERROUS GLUCONATE 324 (38 FE) MG PO TABS
324.0000 mg | ORAL_TABLET | Freq: Every day | ORAL | 3 refills | Status: AC
Start: 2019-08-16 — End: ?

## 2019-08-16 MED ORDER — SENNOSIDES-DOCUSATE SODIUM 8.6-50 MG PO TABS: 1.0000 | ORAL_TABLET | Freq: Two times a day (BID) | ORAL | 0 refills | Status: AC | PRN

## 2019-08-16 NOTE — Discharge Summary (Signed)
Physician Discharge Summary  Patient ID: Sherry Herman MRN: [id number] DOB/AGE: 1980-08-17 39 y.o.  Admit date: 08/15/2019 Discharge date: 08/16/2019  Admission Diagnoses:  Discharge Diagnoses:  Active Problems:   Fibroids   S/P myomectomy   Discharged Condition: stable  Hospital Course: 38yo admitted for abdominal myomectomy- see op note for further details.  Her postop course was uncomplicated and she was discharge home in stable condition on POD#1  Consults: None  Significant Diagnostic Studies: labs:  Results for orders placed or performed during the hospital encounter of 08/15/19 (from the past 24 hour(s))  CBC     Status: Abnormal   Collection Time: 08/16/19  4:16 AM  Result Value Ref Range   WBC 16.4 (H) 4.0 - 10.5 K/uL   RBC 3.63 (L) 3.87 - 5.11 MIL/uL   Hemoglobin 7.3 (L) 12.0 - 15.0 g/dL   HCT 24.5 (L) 36.0 - 46.0 %   MCV 67.5 (L) 80.0 - 100.0 fL   MCH 20.1 (L) 26.0 - 34.0 pg   MCHC 29.8 (L) 30.0 - 36.0 g/dL   RDW 17.2 (H) 11.5 - 15.5 %   Platelets 239 150 - 400 K/uL   nRBC 0.0 0.0 - 0.2 %     Treatments: IV hydration, antibiotics: Ancef and surgery: abdominal myomectom  Discharge Exam: Blood pressure 109/73, pulse 72, temperature 98.1 F (36.7 C), temperature source Oral, resp. rate 17, height 5\' 3"  (1.6 m), weight 59.9 kg, last menstrual period 07/23/2019, SpO2 100 %. General appearance: alert and cooperative Neck: supple, symmetrical, trachea midline Resp: clear to auscultation bilaterally Cardio: regular rate and rhythm, S1, S2 normal, no murmur, click, rub or gallop GI: soft, non-tender; bowel sounds normal; no masses,  no organomegaly Extremities: extremities normal, atraumatic, no cyanosis or edema Skin: Skin color, texture, turgor normal. No rashes or lesions Neurologic: Grossly normal Incision/Wound: honeycomb dressing in place- clean & dry  Disposition: Discharge disposition: 01-Home or Self Care       Discharge Instructions     Remove  dressing in 72 hours   Complete by: As directed    Call MD for:  difficulty breathing, headache or visual disturbances   Complete by: As directed    Call MD for:  extreme fatigue   Complete by: As directed    Call MD for:  persistant dizziness or light-headedness   Complete by: As directed    Call MD for:  persistant nausea and vomiting   Complete by: As directed    Call MD for:  redness, tenderness, or signs of infection (pain, swelling, redness, odor or green/yellow discharge around incision site)   Complete by: As directed    Call MD for:  severe uncontrolled pain   Complete by: As directed    Call MD for:  temperature >100.4   Complete by: As directed    Diet - low sodium heart healthy   Complete by: As directed    Discharge wound care:   Complete by: As directed    Allow glue to peel off on its own.   Increase activity slowly   Complete by: As directed    Lifting restrictions   Complete by: As directed    No heavy lifting, pushing or pulling greater than 15 pounds.   Sexual Activity Restrictions   Complete by: As directed    Pelvic rest x 6 weeks.     Allergies as of 08/16/2019      Reactions   Codeine Rash   Was told this as a  child      Medication List    TAKE these medications   ferrous gluconate 324 MG tablet Commonly known as: FERGON Take 1 tablet (324 mg total) by mouth daily with breakfast.   ibuprofen 800 MG tablet Commonly known as: ADVIL Take 1 tablet (800 mg total) by mouth every 8 (eight) hours.   oxyCODONE 5 MG immediate release tablet Commonly known as: Oxy IR/ROXICODONE Take 1 tablet (5 mg total) by mouth every 4 (four) hours as needed for severe pain or breakthrough pain.   senna-docusate 8.6-50 MG tablet Commonly known as: Senokot-S Take 1 tablet by mouth 2 (two) times daily as needed for mild constipation.   Tri-Lo-Marzia 0.18/0.215/0.25 MG-25 MCG tab Generic drug: Norgestimate-Ethinyl Estradiol Triphasic Take 1 tablet by mouth daily.             Discharge Care Instructions  (From admission, onward)         Start     Ordered   08/15/19 0000  Discharge wound care:    Comments: Allow glue to peel off on its own.   08/15/19 1921         Follow-up Information    Thurnell Lose, MD. Schedule an appointment as soon as possible for a visit in 2 week(s).   Specialty: Obstetrics and Gynecology Why: Post op/incision check Contact information: [address] [place] [address] [phone]           Signed: Annalee Genta 08/16/2019, 7:35 AM

## 2019-08-16 NOTE — Progress Notes (Signed)
Sherry Herman to be D/C'd  per MD order. Discussed with the patient and all questions fully answered.  VSS, Skin clean, dry and intact without evidence of skin break down, no evidence of skin tears noted.  IV catheter discontinued intact. Site without signs and symptoms of complications. Dressing and pressure applied.  An After Visit Summary was printed and given to the patient. Patient received prescription.  D/c education completed with patient/family including follow up instructions, medication list, d/c activities limitations if indicated, with other d/c instructions as indicated by MD - patient able to verbalize understanding, all questions fully answered.   Patient instructed to return to ED, call 911, or call MD for any changes in condition.   Patient to be escorted via [place], and D/C home via private auto.

## 2019-08-16 NOTE — Plan of Care (Signed)
  Problem: Education: Goal: Knowledge of General Education information will improve Description: Including pain rating scale, medication(s)/side effects and non-pharmacologic comfort measures Outcome: Completed/Met   Problem: Health Behavior/Discharge Planning: Goal: Ability to manage health-related needs will improve Outcome: Completed/Met   Problem: Clinical Measurements: Goal: Ability to maintain clinical measurements within normal limits will improve Outcome: Completed/Met Goal: Will remain free from infection Outcome: Completed/Met Goal: Diagnostic test results will improve Outcome: Completed/Met Goal: Respiratory complications will improve Outcome: Completed/Met Goal: Cardiovascular complication will be avoided Outcome: Completed/Met   Problem: Activity: Goal: Risk for activity intolerance will decrease Outcome: Completed/Met   Problem: Nutrition: Goal: Adequate nutrition will be maintained Outcome: Completed/Met   Problem: Coping: Goal: Level of anxiety will decrease Outcome: Completed/Met   Problem: Elimination: Goal: Will not experience complications related to bowel motility Outcome: Completed/Met Goal: Will not experience complications related to urinary retention Outcome: Completed/Met   Problem: Pain Managment: Goal: General experience of comfort will improve Outcome: Completed/Met   Problem: Safety: Goal: Ability to remain free from injury will improve Outcome: Completed/Met   Problem: Skin Integrity: Goal: Risk for impaired skin integrity will decrease Outcome: Completed/Met   Problem: Education: Goal: Required Educational Video(s) Outcome: Completed/Met   Problem: Clinical Measurements: Goal: Ability to maintain clinical measurements within normal limits will improve Outcome: Completed/Met Goal: Postoperative complications will be avoided or minimized Outcome: Completed/Met   Problem: Skin Integrity: Goal: Demonstration of wound healing  without infection will improve Outcome: Completed/Met

## 2019-08-16 NOTE — Op Note (Signed)
Sherry Herman, Sherry Herman MEDICAL RECORD [id number] ACCOUNT [id number] DATE OF BIRTH:Aug 11, 1980 FACILITY: [facility] LOCATION: [facility] PHYSICIAN:Samantha Ragen Al Decant, MD  OPERATIVE REPORT  DATE OF PROCEDURE:  08/15/2019  PREOPERATIVE DIAGNOSIS:  Fibroids.  POSTOPERATIVE DIAGNOSES:   1.  Fibroids. 2.  Left ovarian simple cyst.  PROCEDURES:   1.  Abdominal myomectomy (3 fibroids removed). 2.  Drainage of left ovarian cyst.  SURGEON:  Thurnell Lose, MD  ASSISTANT:  Dr. Christophe Louis and technician.  ANESTHESIA:  General.  ESTIMATED BLOOD LOSS:  100 mL.  BLOOD ADMINISTERED:  None.  DRAINS:  Foley catheter.  SPECIMEN:  Fibroids.  DISPOSITION OF SPECIMEN:  To pathology.  PATIENT DISPOSITION:  To PACU, hemodynamically stable.  COMPLICATIONS:  None.  FINDINGS:  Two large fibroids, 1 was pedunculated at the fundus and the other was anterior submucosal.  There was a small seedling that was on the serosa anteriorly.  Weights of the fibroids, the largest 537.5 g, number 2 was 176 g, number 3 was  unmeasurable.  The patient had a 3-4 cm cyst on the left ovary.  Otherwise, both ovaries and fallopian tubes were normal.  The submucosal fibroid, we did enter the endometrial cavity, for which the patient will need C-sections for any deliveries.  There  were some adhesions of the left colon to the left pelvic sidewall.  Both fallopian tubes appeared normal.  DESCRIPTION OF PROCEDURE:  The patient was identified in the holding area.  She was then taken to the operating room with IV running.  She underwent general anesthesia without complication.  She received Ancef 2 g IV prior to the procedure.  She had SCDs  on and operating room.  A timeout was performed prior to the procedure.  She was prepped and draped in normal sterile fashion.  Foley catheter was placed prior to the start of the procedure.  The abdomen was marked for a 10 cm Pfannenstiel skin incision at the midline.  The skin was incised with  the scalpel and the subcutaneous tissue was opened with the Bovie, making sure the subcutaneous tissue was hemostatic as well.  The fascia was  identified and incised at the midline with the Bovie and extended laterally with the curved Mayo scissors.  The rectus muscles were then dissected sharply off of the fascia.  The peritoneum was then tented up with a hemostat x2 and entered sharply with  the Metzenbaum scissors.  The peritoneum was then stretched, although there was not much give due to the patient's abdominal muscles.  The peritoneum was then cut to make more room.  I then reached into the uterus and palpated the fibroid and it was  pedunculated as described on ultrasound.  I attempted to manually delivered the fibroid with difficulty.  The cuff of the pigtail that was not readily available, so I injected vasopressin into that pedunculated fundal fibroid, did a figure-of-eight suture with the 0 Vicryl and I was able to get  the fibroid out. I then injected vasopressin 20 units and 50 of normal saline at the base.  The base was then grasped with Kocher clamps x2 and the Bovie was then used to amputate the pedunculated fibroid.  Of note, the fibroid really was at the midline,  but it was a little closer to the left fallopian tube, so I started on the right-hand side and once I was able to get inside the capsule, I shelled it out and then ended cutting up away from the tube.  That pedunculated fibroid was  very easily removed.   In 2 layers, we used 0 Vicryl for hemostasis in a baseball stitch with 3-0 Monocryl to close the serosal layer.  Because the uterus itself was actually small, we continued to use the stitch for manipulation of the uterus.  The remaining fibroid was  palpated.  Vasopressin was then used to inject the anterior uterus.  The Bovie was used to open the serosa and Allis clamps were used to grasp the serosa and myometrium.  This incision was extended as needed to deliver the fibroid,  which was quite large.   I also used manual and the Metzenbaum scissors to break up the adhesions of the capsule.  Using the towel clip, the fibroid was then removed.  We were in the endometrial cavity.  The endometrium was actually visualized.  There was bleeding at the base.   I did not want to close the cavity, so I placed Adson forceps upside down inside the cavity and I closed the myometrium in a series of running or interrupted sutures until there was hemostasis.  At one point, I closed it maybe half of the way and we  were looking down into the uterus and there was some bleeding.  I eventually was able to throw some 0 Vicryl in interrupted sutures for hemostasis.  Cavity still remained open and bleeding was minimal.  The remaining portion of the anterior portion of the uterus was closed in layers with 0 Vicryl and once the dome of the fundus of the uterus was closed and hemostatic, the serosa was then reapproximated with 3-0 Monocryl in a baseball stitch fashion.  We  cut the suture up top on the fundus that we were using for mobilization.  There was some bleeding noted there.  We used the Bovie and then had to put another stitch of the Monocryl for hemostasis.    There was a cyst noted on the left ovary that seemed to be more mobile once the fibroid was removed.  That was incised with the Bovie and clear straw-colored fluid was drained.  There was no bleeding from the cyst site.  The pelvis was irrigated  copiously.  Interceed was then placed on the uterine fundus.  We originally removed the pedunculated fibroid just by exteriorizing that particular fibroid.  However, once we get that off and the uterus was small, I did put 2 moistened laparotomy sponges to pack the bowel and we used the O'Connor-O'Sullivan.  In  trying to pack the bowel, we noticed that there was some left abdominal wall adhesions.  They were filmy.  I attempted to take them down with the Metzenbaum scissors to pack them without  injury to the bowel.  It was packed safely and then after we  removed the packing, I looked on that side and there was no injury or trauma to the colon.  The peritoneum was not visible on the upper left side, so I did a U stitch with 2-0 chromic.  I was able to reapproximate the peritoneum low, but was unable to grasp any peritoneum on the left hand side, so I did a U stitch with the rectus muscle and  peritoneum, except in the upper left corner.  After the U stitch, the fascia was then reapproximated with 0 Vicryl with 2 sutures.  The patient had minimal subcutaneous fat and was very thin, so I did not do a subcutaneous closure.  I reapproximated the  skin with 4-0 Monocryl in a subcuticular fashion.  Dermabond was applied to the incision.  All instrument, sponge and needle counts were correct x3.  The patient tolerated the procedure well and she went to the recovery room in stable condition.  VN/NUANCE  D:08/15/2019 T:08/16/2019 JOB:[id number]

## 2019-08-17 LAB — CYTOLOGY - PAP: Diagnosis: NEGATIVE

## 2021-05-07 IMAGING — US US PELVIS COMPLETE WITH TRANSVAGINAL
1 series · 13 of 25 positions shown · non-contrast
Comparison: None

CLINICAL DATA: Uterine enlargement



[Series 1: us pelvis complete with transvaginal · 0.26mm/px · 13 of 70 slices shown]
[im 1/70]
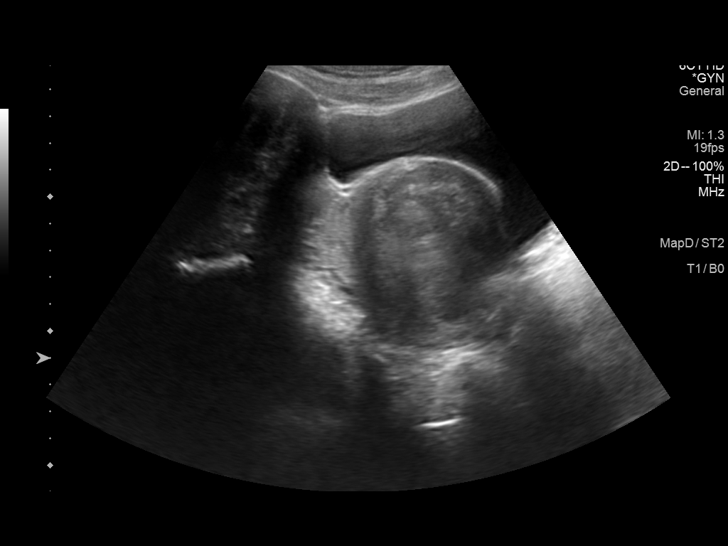
[im 6/70]
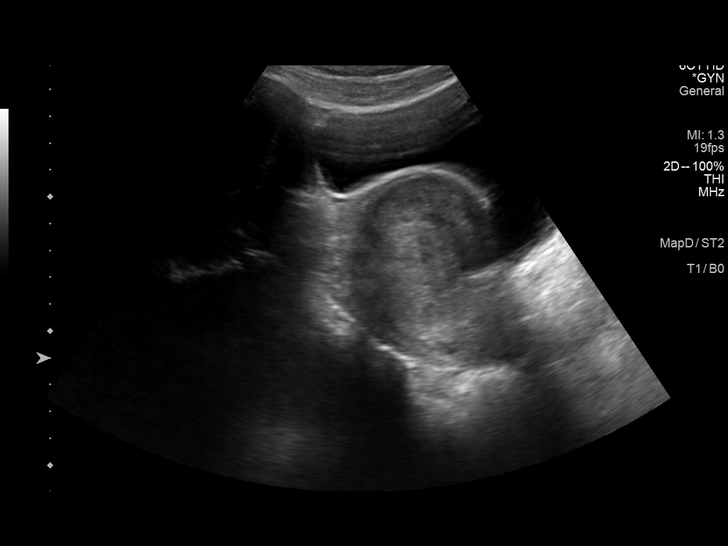
[im 12/70]
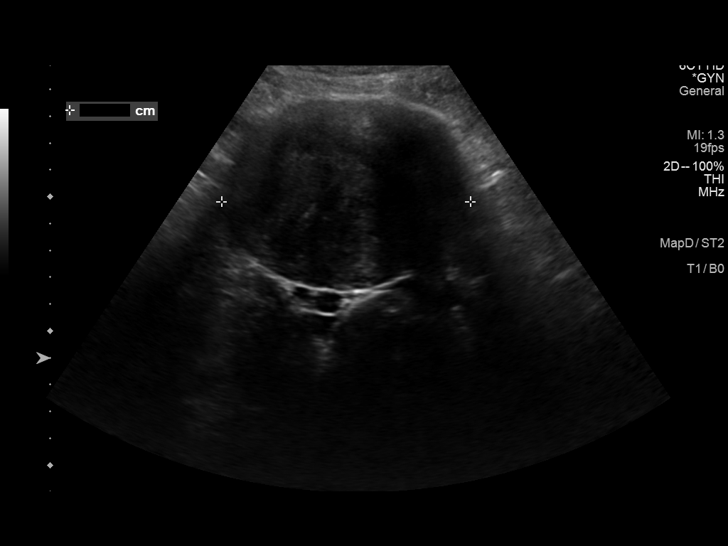
[im 18/70]
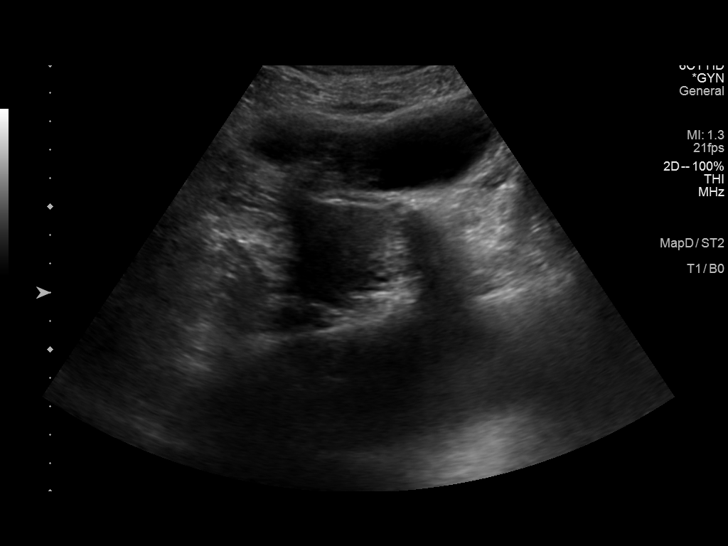
[im 24/70]
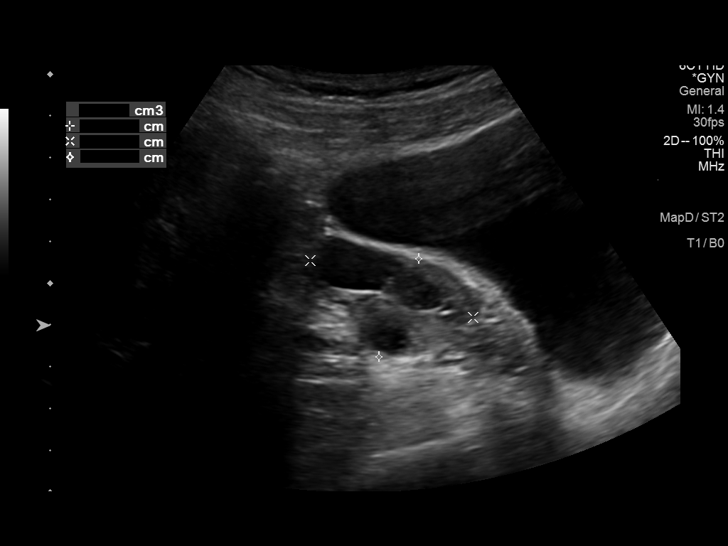
[im 29/70]
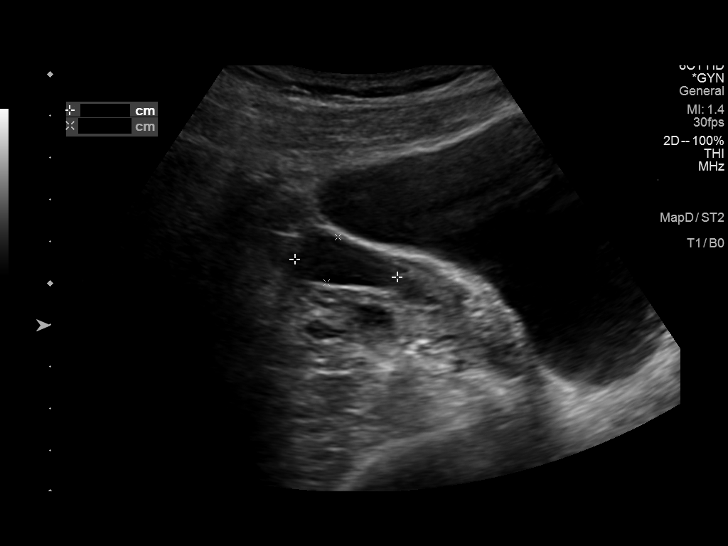
[im 35/70]
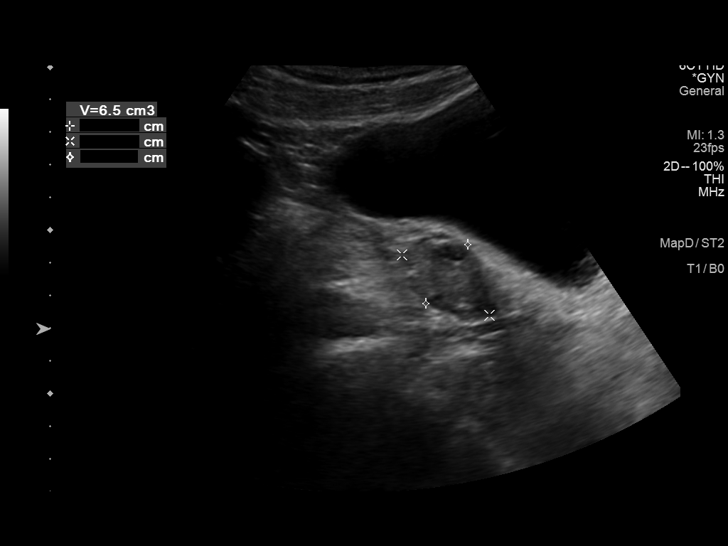
[im 41/70]
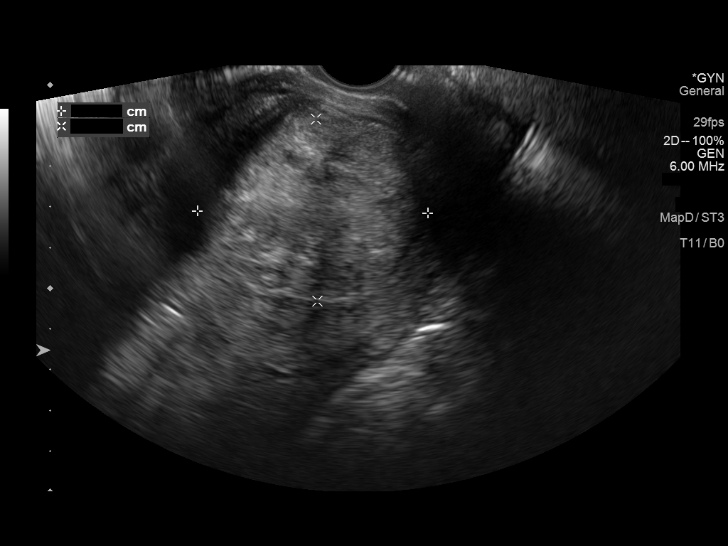
[im 47/70]
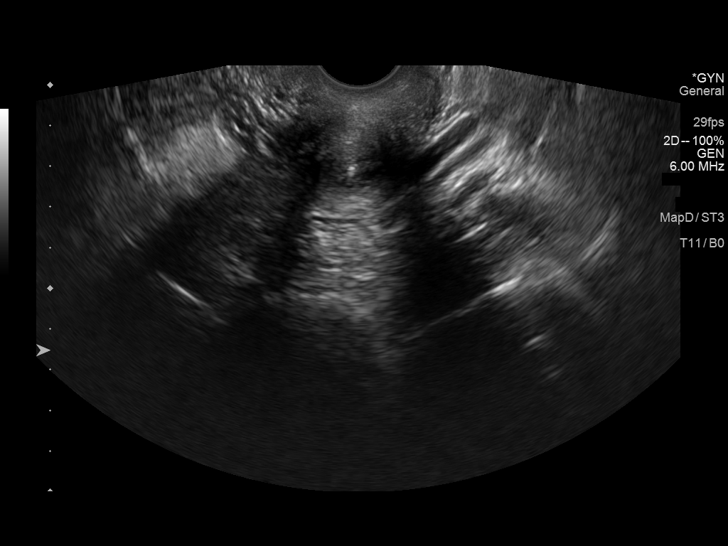
[im 52/70]
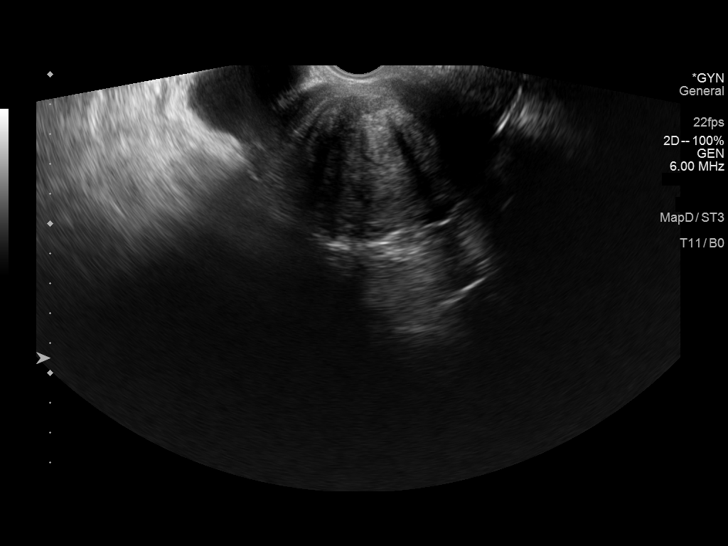
[im 58/70]
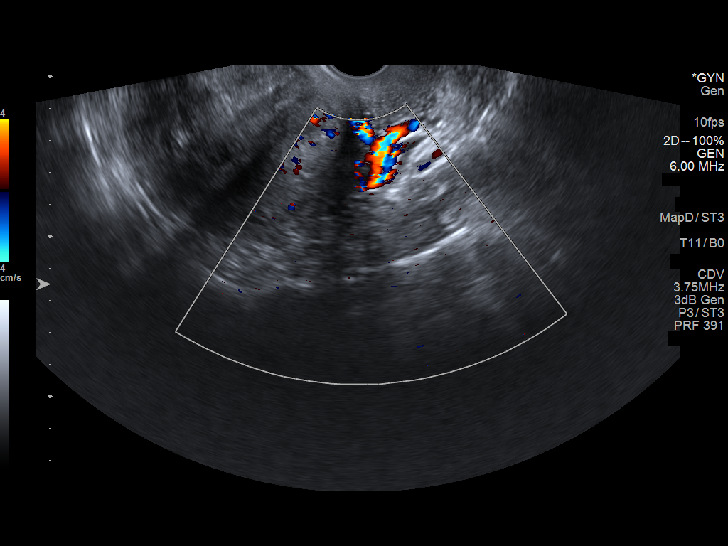
[im 64/70]
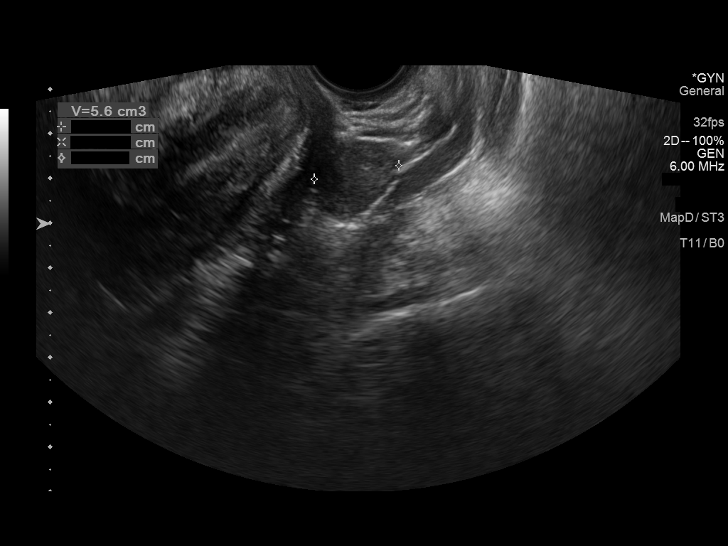
[im 70/70]
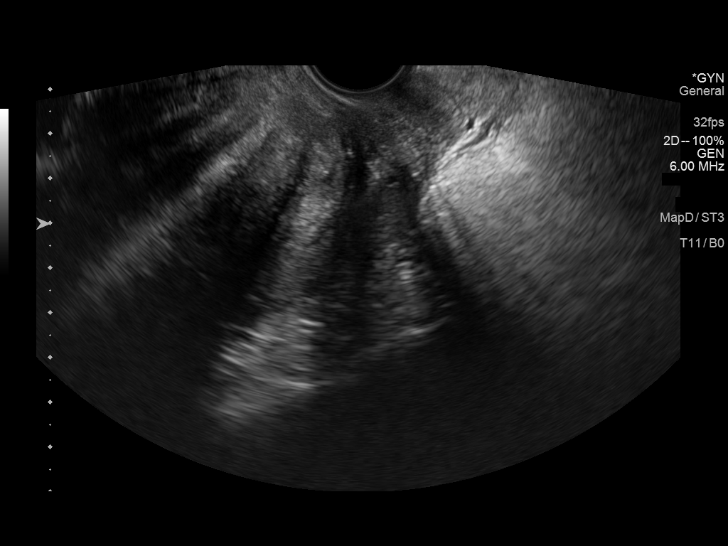

[13 of 25 positions shown; findings below may reference images not displayed]

FINDINGS: Uterus

Measurements: 16.4 x 7.1 x 8.8 cm = volume: 532 mL. Large
pedunculated leiomyoma seen extending from the uterine fundus, 8.5 x
7.3 x 9.3 cm. Additionally, a large fundal leiomyoma is seen,
subserosal, 6.5 x 6.0 x 8.5 cm. Based on size and position, this
likely extends submucosal as well.

Endometrium

Unable to visualize, likely distorted due to large fundal leiomyoma

Right ovary

Measurements: 4.1 x 2.5 x 3.5 cm = volume: 19 mL. Dominant follicle
2.5 cm diameter without additional mass

Left ovary

Measurements: 3.1 x 1.8 x 1.9 cm = volume: 5.6 mL. Normal morphology
without mass

Other findings

No free pelvic fluid.  No additional pelvic masses.
IMPRESSION: 8.5 cm diameter leiomyoma at uterine fundus, likely transmural.

Large exophytic 9.3 cm diameter leiomyoma arising from uterine
fundus extending cranially.

Nonvisualization of endometrial complex likely due to distortion of
the uterus by large leiomyoma.
